# Patient Record
Sex: Male | Born: 1940 | Race: White | Hispanic: No | Marital: Married | Smoking: Former smoker
Health system: Southern US, Community
[De-identification: ages and names within clinical notes are randomized; demographics above are authoritative.]

## PROBLEM LIST (undated history)

## (undated) DIAGNOSIS — S2509XA Other specified injury of thoracic aorta, initial encounter: Secondary | ICD-10-CM

## (undated) DIAGNOSIS — E7800 Pure hypercholesterolemia, unspecified: Secondary | ICD-10-CM

## (undated) DIAGNOSIS — I10 Essential (primary) hypertension: Secondary | ICD-10-CM

## (undated) HISTORY — DX: Other specified injury of thoracic aorta, initial encounter: S25.09XA

## (undated) HISTORY — DX: Essential (primary) hypertension: I10

## (undated) HISTORY — PX: PROSTATE SURGERY: SHX751

## (undated) HISTORY — PX: OTHER SURGICAL HISTORY: SHX169

## (undated) HISTORY — DX: Pure hypercholesterolemia, unspecified: E78.00

---

## 2008-04-29 HISTORY — PX: OTHER SURGICAL HISTORY: SHX169

## 2008-06-29 ENCOUNTER — Ambulatory Visit: Payer: Self-pay | Admitting: Cardiothoracic Surgery

## 2008-10-19 ENCOUNTER — Encounter: Admission: RE | Admit: 2008-10-19 | Discharge: 2008-10-19 | Payer: Self-pay | Admitting: Cardiothoracic Surgery

## 2008-10-19 ENCOUNTER — Ambulatory Visit: Payer: Self-pay | Admitting: Cardiothoracic Surgery

## 2009-10-18 ENCOUNTER — Ambulatory Visit: Payer: Self-pay | Admitting: Cardiothoracic Surgery

## 2009-10-18 ENCOUNTER — Encounter: Admission: RE | Admit: 2009-10-18 | Discharge: 2009-10-18 | Payer: Self-pay | Admitting: Cardiothoracic Surgery

## 2010-04-01 ENCOUNTER — Encounter: Payer: Self-pay | Admitting: Cardiothoracic Surgery
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
## 2010-09-19 ENCOUNTER — Other Ambulatory Visit: Payer: Self-pay | Admitting: Cardiothoracic Surgery

## 2010-09-19 DIAGNOSIS — I7101 Dissection of thoracic aorta: Secondary | ICD-10-CM

## 2010-10-31 ENCOUNTER — Ambulatory Visit: Payer: Self-pay | Admitting: Cardiothoracic Surgery

## 2010-10-31 ENCOUNTER — Inpatient Hospital Stay: Admission: RE | Admit: 2010-10-31 | Payer: Self-pay | Source: Ambulatory Visit

## 2010-11-14 ENCOUNTER — Ambulatory Visit: Payer: Self-pay | Admitting: Cardiothoracic Surgery

## 2010-11-25 ENCOUNTER — Other Ambulatory Visit: Payer: Self-pay | Admitting: Cardiothoracic Surgery

## 2010-11-26 LAB — BUN: BUN: 22 mg/dL (ref 6–23)

## 2010-11-26 LAB — CREATININE, SERUM: Creat: 1.03 mg/dL (ref 0.50–1.35)

## 2010-11-27 DIAGNOSIS — I10 Essential (primary) hypertension: Secondary | ICD-10-CM

## 2010-11-27 DIAGNOSIS — I7101 Dissection of thoracic aorta: Secondary | ICD-10-CM | POA: Insufficient documentation

## 2010-11-27 DIAGNOSIS — E7800 Pure hypercholesterolemia, unspecified: Secondary | ICD-10-CM | POA: Insufficient documentation

## 2010-11-28 ENCOUNTER — Encounter: Payer: Self-pay | Admitting: Cardiothoracic Surgery

## 2010-11-28 ENCOUNTER — Ambulatory Visit
Admission: RE | Admit: 2010-11-28 | Discharge: 2010-11-28 | Disposition: A | Discharge status: Home / Self Care | Payer: Medicare Other | Source: Ambulatory Visit | Attending: Cardiothoracic Surgery | Admitting: Cardiothoracic Surgery

## 2010-11-28 ENCOUNTER — Ambulatory Visit (INDEPENDENT_AMBULATORY_CARE_PROVIDER_SITE_OTHER): Payer: Medicare Other | Admitting: Cardiothoracic Surgery

## 2010-11-28 VITALS — BP 160/93 | HR 64 | Resp 20 | Ht 71.0 in | Wt 182.0 lb

## 2010-11-28 DIAGNOSIS — S2509XA Other specified injury of thoracic aorta, initial encounter: Secondary | ICD-10-CM | POA: Insufficient documentation

## 2010-11-28 DIAGNOSIS — S2500XA Unspecified injury of thoracic aorta, initial encounter: Secondary | ICD-10-CM

## 2010-11-28 DIAGNOSIS — I7101 Dissection of thoracic aorta: Secondary | ICD-10-CM

## 2010-11-28 MED ORDER — IOHEXOL 300 MG/ML  SOLN: 100.0000 mL | Freq: Once | INTRAMUSCULAR | Status: AC | PRN
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
## 2011-12-03 ENCOUNTER — Other Ambulatory Visit: Payer: Self-pay | Admitting: Cardiothoracic Surgery

## 2011-12-03 DIAGNOSIS — I7101 Dissection of thoracic aorta: Secondary | ICD-10-CM

## 2011-12-03 DIAGNOSIS — I71019 Dissection of thoracic aorta, unspecified: Secondary | ICD-10-CM

## 2011-12-03 DIAGNOSIS — S2509XA Other specified injury of thoracic aorta, initial encounter: Secondary | ICD-10-CM

## 2011-12-04 ENCOUNTER — Encounter: Payer: Self-pay | Admitting: Cardiothoracic Surgery

## 2011-12-04 ENCOUNTER — Ambulatory Visit
Admission: RE | Admit: 2011-12-04 | Discharge: 2011-12-04 | Disposition: A | Discharge status: Home / Self Care | Payer: Medicare Other | Source: Ambulatory Visit | Attending: Cardiothoracic Surgery | Admitting: Cardiothoracic Surgery

## 2011-12-04 ENCOUNTER — Ambulatory Visit (INDEPENDENT_AMBULATORY_CARE_PROVIDER_SITE_OTHER): Payer: Medicare Other | Admitting: Cardiothoracic Surgery

## 2011-12-04 VITALS — BP 162/84 | HR 61 | Resp 18 | Ht 71.0 in | Wt 172.0 lb

## 2011-12-04 DIAGNOSIS — I7101 Dissection of thoracic aorta: Secondary | ICD-10-CM

## 2011-12-04 DIAGNOSIS — S2500XA Unspecified injury of thoracic aorta, initial encounter: Secondary | ICD-10-CM

## 2011-12-04 DIAGNOSIS — Z09 Encounter for follow-up examination after completed treatment for conditions other than malignant neoplasm: Secondary | ICD-10-CM
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
## 2011-12-04 NOTE — Patient Instructions (Signed)
Doing well post-op 

## 2013-05-12 ENCOUNTER — Other Ambulatory Visit: Payer: Self-pay

## 2013-05-12 DIAGNOSIS — S2500XA Unspecified injury of thoracic aorta, initial encounter: Secondary | ICD-10-CM

## 2013-05-31 ENCOUNTER — Other Ambulatory Visit: Payer: Self-pay | Admitting: Cardiothoracic Surgery

## 2013-05-31 LAB — CREATININE, SERUM: Creat: 0.95 mg/dL (ref 0.50–1.35)

## 2013-05-31 LAB — BUN: BUN: 18 mg/dL (ref 6–23)

## 2013-06-02 ENCOUNTER — Ambulatory Visit
Admission: RE | Admit: 2013-06-02 | Discharge: 2013-06-02 | Disposition: A | Discharge status: Home / Self Care | Payer: Medicare Other | Source: Ambulatory Visit | Attending: Cardiothoracic Surgery | Admitting: Cardiothoracic Surgery

## 2013-06-02 ENCOUNTER — Ambulatory Visit (INDEPENDENT_AMBULATORY_CARE_PROVIDER_SITE_OTHER): Payer: Medicare Other | Admitting: Cardiothoracic Surgery

## 2013-06-02 ENCOUNTER — Encounter: Payer: Self-pay | Admitting: Cardiothoracic Surgery

## 2013-06-02 VITALS — BP 170/97 | HR 60 | Resp 20 | Ht 71.0 in | Wt 182.0 lb

## 2013-06-02 DIAGNOSIS — S2500XA Unspecified injury of thoracic aorta, initial encounter: Secondary | ICD-10-CM

## 2013-06-02 DIAGNOSIS — I714 Abdominal aortic aneurysm, without rupture, unspecified: Secondary | ICD-10-CM

## 2013-06-02 DIAGNOSIS — S2509XA Other specified injury of thoracic aorta, initial encounter: Secondary | ICD-10-CM

## 2013-06-02 DIAGNOSIS — I7101 Dissection of thoracic aorta: Secondary | ICD-10-CM

## 2013-06-02 DIAGNOSIS — I71019 Dissection of thoracic aorta, unspecified: Secondary | ICD-10-CM

## 2013-06-02 DIAGNOSIS — Z09 Encounter for follow-up examination after completed treatment for conditions other than malignant neoplasm: Secondary | ICD-10-CM

## 2013-06-02 MED ORDER — IOHEXOL 350 MG/ML SOLN
80.0000 mL | Freq: Once | INTRAVENOUS | Status: AC | PRN
  Administered 2013-06-02: 80 mL via INTRAVENOUS
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
## 2018-11-09 DEATH — deceased
